# Patient Record
Sex: Male | Born: 2000
Health system: Southern US, Community
[De-identification: ages and names within clinical notes are randomized; demographics above are authoritative.]

## PROBLEM LIST (undated history)

## (undated) DIAGNOSIS — F32A Depression, unspecified: Secondary | ICD-10-CM

## (undated) DIAGNOSIS — K219 Gastro-esophageal reflux disease without esophagitis: Secondary | ICD-10-CM

## (undated) DIAGNOSIS — F419 Anxiety disorder, unspecified: Secondary | ICD-10-CM

## (undated) HISTORY — DX: Depression, unspecified: F32.A

## (undated) HISTORY — DX: Anxiety disorder, unspecified: F41.9

---
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
## 2020-07-26 DIAGNOSIS — Z1331 Encounter for screening for depression: Secondary | ICD-10-CM | POA: Diagnosis not present

## 2020-07-26 DIAGNOSIS — Z1389 Encounter for screening for other disorder: Secondary | ICD-10-CM | POA: Diagnosis not present

## 2020-07-26 DIAGNOSIS — Z68.41 Body mass index (BMI) pediatric, less than 5th percentile for age: Secondary | ICD-10-CM | POA: Diagnosis not present

## 2020-07-26 DIAGNOSIS — R000 Tachycardia, unspecified: Secondary | ICD-10-CM | POA: Diagnosis not present

## 2020-07-26 DIAGNOSIS — F321 Major depressive disorder, single episode, moderate: Secondary | ICD-10-CM | POA: Diagnosis not present

## 2020-07-26 DIAGNOSIS — Z0001 Encounter for general adult medical examination with abnormal findings: Secondary | ICD-10-CM | POA: Diagnosis not present

## 2020-07-26 DIAGNOSIS — Z0000 Encounter for general adult medical examination without abnormal findings: Secondary | ICD-10-CM | POA: Diagnosis not present

## 2020-08-08 ENCOUNTER — Other Ambulatory Visit: Payer: Self-pay

## 2020-08-08 ENCOUNTER — Ambulatory Visit: Payer: BC Managed Care – PPO | Admitting: Cardiology

## 2020-08-08 ENCOUNTER — Encounter: Payer: Self-pay | Admitting: Cardiology

## 2020-08-08 VITALS — BP 135/88 | HR 93 | Temp 98.7°F | Resp 17 | Ht 66.0 in | Wt 140.0 lb

## 2020-08-08 DIAGNOSIS — I4510 Unspecified right bundle-branch block: Secondary | ICD-10-CM

## 2020-08-08 DIAGNOSIS — F17210 Nicotine dependence, cigarettes, uncomplicated: Secondary | ICD-10-CM | POA: Diagnosis not present

## 2020-08-08 DIAGNOSIS — R002 Palpitations: Secondary | ICD-10-CM

## 2020-08-08 DIAGNOSIS — F32A Depression, unspecified: Secondary | ICD-10-CM | POA: Diagnosis not present

## 2020-08-08 DIAGNOSIS — F17200 Nicotine dependence, unspecified, uncomplicated: Secondary | ICD-10-CM

## 2020-08-08 DIAGNOSIS — Z8249 Family history of ischemic heart disease and other diseases of the circulatory system: Secondary | ICD-10-CM

## 2020-08-08 NOTE — Progress Notes (Signed)
Date:  08/08/2020   ID:  Todd Johnson, DOB 05-03-2001, MRN [id number]  PCP:  Cory Munch, PA-C  Cardiologist:  Rex Kras, DO, [facility] (established care 08/08/2020)  REASON FOR CONSULT: Supraventricular tachycardia  REQUESTING PHYSICIAN:  Cory Munch, PA-C [place],  [place] [id number]  Chief Complaint  Patient presents with  . New Patient (Initial Visit)    Ref by Collene Mares  . Palpitations    HPI  Todd Johnson is a 20 y.o. male who presents to the office with a chief complaint of " palpitations." Patient's past medical history and cardiovascular risk factors include: Active nicotine use, anxiety, depression.  He is referred to the office at the request of Cory Munch, PA-C for evaluation of supraventricular tachycardia.  Palpitations: Patient states that he has been experiencing palpitations for the last 3 years however over the last 1 year it has been more pronounced.  The symptoms occur randomly, no improving or worsening factors, more noticeable at rest, last for 5 minutes in duration, self-limiting.  Patient denies any syncopal events.  Patient denies any recent sick contacts, no recent infections, patient is not vaccinated for COVID-19, no new over-the-counter medications, no use of herbal supplements/illicit drugs, stimulants, or coffee.  However, patient does drink energy drinks sporadically (3/month) and consumes four 20 ounces Sprite bottles on a daily basis.  Patient has not seen a pediatric cardiologist while growing up.  He used to be active in his gym class through his grade school and never had a syncopal event.  Patient did not play any particular sports while growing up.  Family history of premature coronary disease: Patient states that his father had 2 myocardial infarctions last year at the age of 44.   FUNCTIONAL STATUS: No structured exercise program or daily routine.   ALLERGIES: No Known Allergies  MEDICATION LIST  PRIOR TO VISIT: Current Meds  Medication Sig  . buPROPion (WELLBUTRIN XL) 300 MG 24 hr tablet Take 300 mg by mouth daily.  . mupirocin cream (BACTROBAN) 2 % Apply 1 application topically 2 (two) times daily.     PAST MEDICAL HISTORY: Past Medical History:  Diagnosis Date  . Anxiety   . Depression   . GERD (gastroesophageal reflux disease)     PAST SURGICAL HISTORY: History reviewed. No pertinent surgical history.  FAMILY HISTORY: The patient family history includes Heart attack in his father; Heart failure in his mother.  SOCIAL HISTORY:  The patient  reports that he has been smoking cigarettes. He has been smoking about 0.50 packs per day. He has never used smokeless tobacco. He reports that he does not drink alcohol and does not use drugs.  REVIEW OF SYSTEMS: Review of Systems  Constitutional: Negative for chills and fever.  HENT: Negative for hoarse voice and nosebleeds.   Eyes: Negative for discharge, double vision and pain.  Cardiovascular: Positive for palpitations. Negative for chest pain, claudication, dyspnea on exertion, leg swelling, near-syncope, orthopnea, paroxysmal nocturnal dyspnea and syncope.  Respiratory: Negative for hemoptysis and shortness of breath.   Musculoskeletal: Negative for muscle cramps and myalgias.  Gastrointestinal: Negative for abdominal pain, constipation, diarrhea, hematemesis, hematochezia, melena, nausea and vomiting.  Neurological: Negative for dizziness and light-headedness.    PHYSICAL EXAM: Vitals with BMI 08/08/2020 09/17/2019 07/29/2019  Height 5' 6"  5' 6"  5' 6"   Weight 140 lbs 130 lbs 135 lbs  BMI 22.61 24.26 83.4  Systolic 196 222 979  Diastolic 88 76 79  Pulse 93  78 89    CONSTITUTIONAL: Well-developed and well-nourished. No acute distress.  SKIN: Skin is warm and dry. No rash noted. No cyanosis. No pallor. No jaundice HEAD: Normocephalic and atraumatic.  EYES: No scleral icterus MOUTH/THROAT: Moist oral membranes.  NECK:  No JVD present. No thyromegaly noted. No carotid bruits  LYMPHATIC: No visible cervical adenopathy.  CHEST Normal respiratory effort. No intercostal retractions  LUNGS: Clear to auscultation bilaterally.  No stridor. No wheezes. No rales.  CARDIOVASCULAR: Regular rate and rhythm, positive S1-S2, no murmurs rubs or gallops appreciated. ABDOMINAL: No apparent ascites.  EXTREMITIES: No peripheral edema  HEMATOLOGIC: No significant bruising NEUROLOGIC: Oriented to person, place, and time. Nonfocal. Normal muscle tone.  PSYCHIATRIC: Normal mood and affect. Normal behavior. Cooperative  CARDIAC DATABASE: EKG: 08/08/2020: Normal sinus rhythm, 84 bpm, normal axis, IRBBB, without underlying ischemia or injury pattern.  Echocardiogram: No results found for this or any previous visit from the past 1095 days.    Stress Testing: No results found for this or any previous visit from the past 1095 days.   Heart Catheterization: None   LABORATORY DATA: No flowsheet data found.  No flowsheet data found.  Lipid Panel  No results found for: CHOL, TRIG, HDL, CHOLHDL, VLDL, LDLCALC, LDLDIRECT, LABVLDL  No components found for: NTPROBNP No results for input(s): PROBNP in the last 8760 hours. No results for input(s): TSH in the last 8760 hours.  BMP No results for input(s): NA, K, CL, CO2, GLUCOSE, BUN, CREATININE, CALCIUM, GFRNONAA, GFRAA in the last 8760 hours.  HEMOGLOBIN A1C No results found for: HGBA1C, MPG  External Labs: Collected: 07/26/2020 Creatinine 0.81 mg/dL. eGFR: >60 mL/min per 1.73 m Hemoglobin 15.5 g/dL, hematocrit 46.8% Lipid profile: Total cholesterol 117, triglycerides 89, HDL 40, LDL 60  IMPRESSION:    ICD-10-CM   1. Palpitations  R00.2 EKG 12-Lead    TSH    LONG TERM MONITOR (3-14 DAYS)  2. Incomplete right bundle branch block (RBBB)  I45.10 PCV ECHOCARDIOGRAM COMPLETE  3. Smoking  F17.200   4. Depression, unspecified depression type  F32.A   5. Family history  of premature CAD  Z82.49      RECOMMENDATIONS: [name] is a 20 y.o. male whose past medical history and cardiac risk factors include: Active nicotine use, anxiety, depression.  Palpitations: Patient is referred to the office for evaluation of supraventricular tachycardia.  However, I do not have any documentation to suggest that he has underlying SVT.  We also reached out to his primary care provider for prior EKGs but none available. EKG today shows normal sinus rhythm without underlying ischemia or injury pattern. Check TSH. 14-day extended Holter monitor to evaluate for underlying dysrhythmias.  Given his family history of premature CAD and incomplete right bundle branch block recommend baseline echocardiogram to evaluate for LVEF and to evaluate for valvular heart disease.  Smoking: Tobacco cessation counseling: Currently smoking 0.5 packs/day   Patient was informed of the dangers of tobacco abuse including stroke, cancer, and MI, as well as benefits of tobacco cessation. Patient is not willing to quit at this time. Approximately 5 mins were spent counseling patient cessation techniques. We discussed various methods to help quit smoking, including deciding on a date to quit, joining a support group, pharmacological agents- nicotine gum/patch/lozenges. I will reassess his progress at the next follow-up visit  FINAL MEDICATION LIST END OF ENCOUNTER: No orders of the defined types were placed in this encounter.   There are no discontinued medications.   Current Outpatient  Medications:  .  buPROPion (WELLBUTRIN XL) 300 MG 24 hr tablet, Take 300 mg by mouth daily., Disp: , Rfl:  .  mupirocin cream (BACTROBAN) 2 %, Apply 1 application topically 2 (two) times daily., Disp: 15 g, Rfl: 0  Orders Placed This Encounter  Procedures  . TSH  . LONG TERM MONITOR (3-14 DAYS)  . EKG 12-Lead  . PCV ECHOCARDIOGRAM COMPLETE    There are no Patient Instructions on file for this visit.    --Continue cardiac medications as reconciled in final medication list. --Return in about 6 weeks (around 09/19/2020) for Follow up, Palpitations. Or sooner if needed. --Continue follow-up with your primary care physician regarding the management of your other chronic comorbid conditions.  Patient's questions and concerns were addressed to his satisfaction. He voices understanding of the instructions provided during this encounter.   This note was created using a voice recognition software as a result there may be grammatical errors inadvertently enclosed that do not reflect the nature of this encounter. Every attempt is made to correct such errors.  Rex Kras, [place], [facility]  Pager: [phone] Office: [phone]

## 2020-08-24 ENCOUNTER — Inpatient Hospital Stay: Payer: BC Managed Care – PPO

## 2020-08-24 ENCOUNTER — Other Ambulatory Visit: Payer: BC Managed Care – PPO

## 2020-08-24 DIAGNOSIS — R002 Palpitations: Secondary | ICD-10-CM

## 2020-08-24 DIAGNOSIS — Z68.41 Body mass index (BMI) pediatric, less than 5th percentile for age: Secondary | ICD-10-CM | POA: Diagnosis not present

## 2020-08-24 DIAGNOSIS — F320 Major depressive disorder, single episode, mild: Secondary | ICD-10-CM | POA: Diagnosis not present

## 2020-09-22 ENCOUNTER — Ambulatory Visit: Payer: BC Managed Care – PPO | Admitting: Cardiology

## 2020-10-02 DIAGNOSIS — F321 Major depressive disorder, single episode, moderate: Secondary | ICD-10-CM | POA: Diagnosis not present

## 2020-10-31 DIAGNOSIS — F320 Major depressive disorder, single episode, mild: Secondary | ICD-10-CM | POA: Diagnosis not present

## 2020-11-16 DIAGNOSIS — R0789 Other chest pain: Secondary | ICD-10-CM | POA: Diagnosis not present

## 2020-11-16 DIAGNOSIS — R1013 Epigastric pain: Secondary | ICD-10-CM | POA: Diagnosis not present

## 2020-12-11 DIAGNOSIS — R000 Tachycardia, unspecified: Secondary | ICD-10-CM | POA: Diagnosis not present

## 2020-12-11 DIAGNOSIS — R079 Chest pain, unspecified: Secondary | ICD-10-CM | POA: Diagnosis not present

## 2020-12-11 DIAGNOSIS — R002 Palpitations: Secondary | ICD-10-CM | POA: Diagnosis not present

## 2020-12-11 DIAGNOSIS — F17290 Nicotine dependence, other tobacco product, uncomplicated: Secondary | ICD-10-CM | POA: Diagnosis not present

## 2020-12-11 DIAGNOSIS — R0789 Other chest pain: Secondary | ICD-10-CM | POA: Diagnosis not present

## 2021-04-23 DIAGNOSIS — R110 Nausea: Secondary | ICD-10-CM | POA: Diagnosis not present

## 2021-04-23 DIAGNOSIS — A084 Viral intestinal infection, unspecified: Secondary | ICD-10-CM | POA: Diagnosis not present

## 2021-04-23 DIAGNOSIS — R197 Diarrhea, unspecified: Secondary | ICD-10-CM | POA: Diagnosis not present

## 2021-12-21 DIAGNOSIS — R509 Fever, unspecified: Secondary | ICD-10-CM | POA: Diagnosis not present

## 2021-12-21 DIAGNOSIS — U071 COVID-19: Secondary | ICD-10-CM | POA: Diagnosis not present

## 2021-12-21 DIAGNOSIS — R0789 Other chest pain: Secondary | ICD-10-CM | POA: Diagnosis not present
